# Patient Record
Sex: Female | Born: 1960 | Race: White | Hispanic: No | Marital: Married | Smoking: Never smoker
Health system: Southern US, Community
[De-identification: ages and names within clinical notes are randomized; demographics above are authoritative.]

## PROBLEM LIST (undated history)

## (undated) DIAGNOSIS — E7800 Pure hypercholesterolemia, unspecified: Secondary | ICD-10-CM

## (undated) DIAGNOSIS — I10 Essential (primary) hypertension: Secondary | ICD-10-CM

## (undated) DIAGNOSIS — G4730 Sleep apnea, unspecified: Secondary | ICD-10-CM

## (undated) DIAGNOSIS — E119 Type 2 diabetes mellitus without complications: Secondary | ICD-10-CM

## (undated) DIAGNOSIS — R748 Abnormal levels of other serum enzymes: Secondary | ICD-10-CM

## (undated) HISTORY — PX: KNEE SURGERY: SHX244

## (undated) HISTORY — PX: EYE SURGERY: SHX253

---

## 2009-12-10 ENCOUNTER — Inpatient Hospital Stay (HOSPITAL_COMMUNITY): Admission: EM | Admit: 2009-12-10 | Discharge: 2009-12-12 | Payer: Self-pay | Admitting: Emergency Medicine

## 2010-11-08 IMAGING — CR DG CHEST 2V
2 series · 2 of 2 positions shown · non-contrast
Comparison: PA and lateral chest 12/10/2009.

CLINICAL DATA: Fever and cough.

CHEST - 2 VIEW

[view not recorded (1 of 2)]
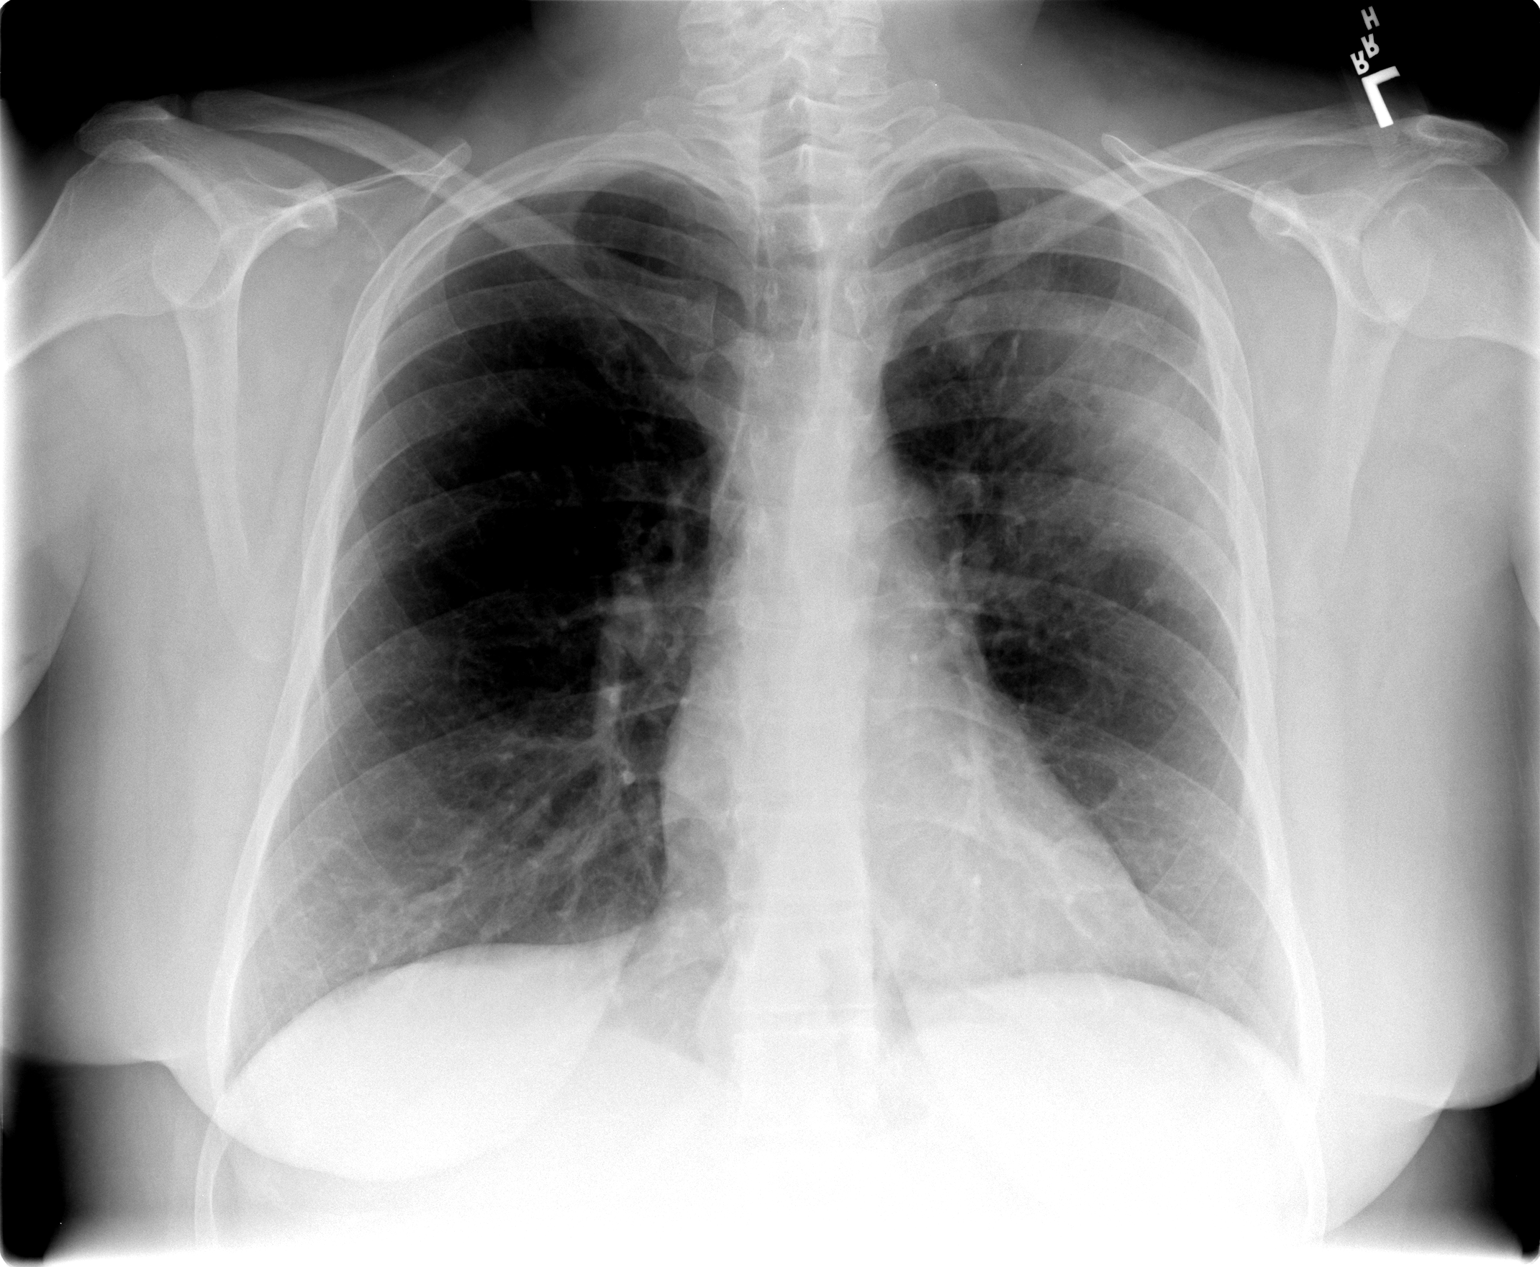

[view not recorded (2 of 2)]
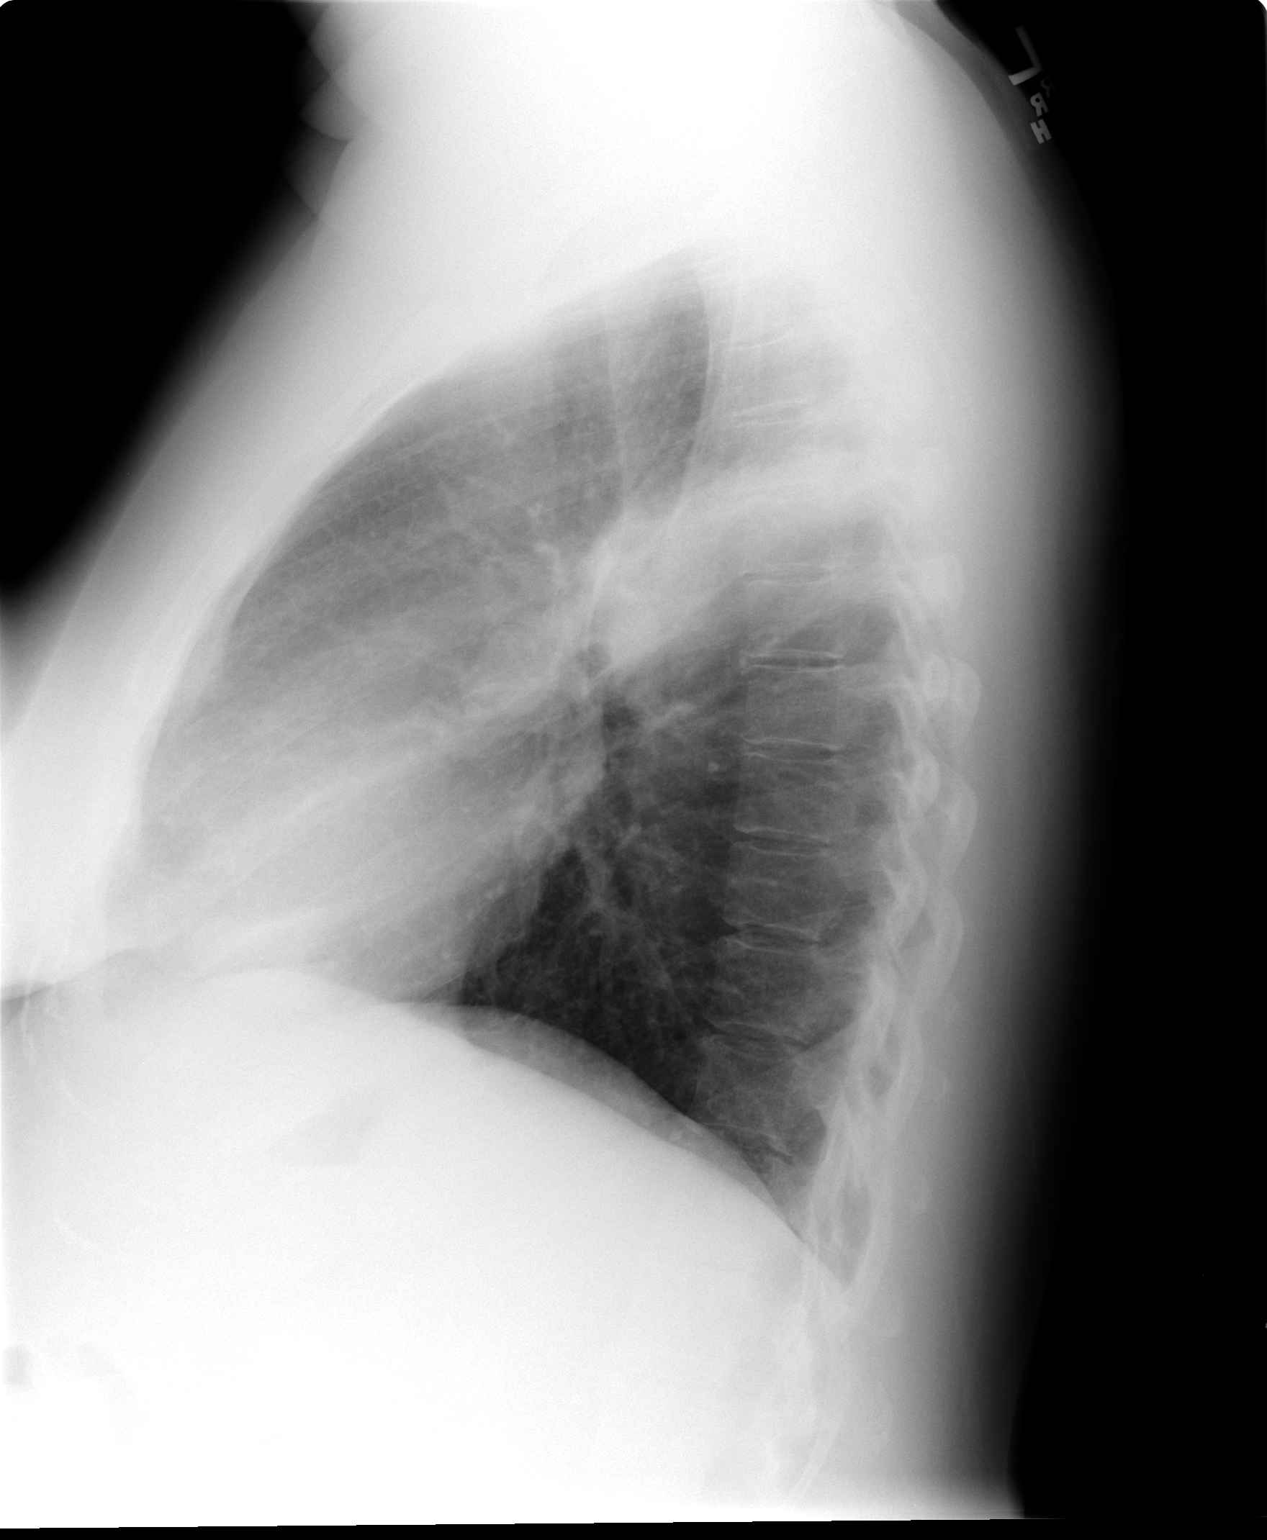

[2 of 2 positions shown; findings below may reference images not displayed]

FINDINGS: There has been no interval change in airspace disease in
the posterior segment of the left upper lobe.  Right lung remains
clear.  Heart size is normal.  No effusion.
IMPRESSION: No change in left upper lobe pneumonia.

## 2011-03-24 LAB — COMPREHENSIVE METABOLIC PANEL
AST: 27 U/L (ref 0–37)
AST: 29 U/L (ref 0–37)
Albumin: 2.5 g/dL — ABNORMAL LOW (ref 3.5–5.2)
Albumin: 3.4 g/dL — ABNORMAL LOW (ref 3.5–5.2)
BUN: 12 mg/dL (ref 6–23)
BUN: 9 mg/dL (ref 6–23)
Calcium: 8 mg/dL — ABNORMAL LOW (ref 8.4–10.5)
Calcium: 9.1 mg/dL (ref 8.4–10.5)
Chloride: 94 mEq/L — ABNORMAL LOW (ref 96–112)
Creatinine, Ser: 0.74 mg/dL (ref 0.4–1.2)
Creatinine, Ser: 0.95 mg/dL (ref 0.4–1.2)
Total Protein: 5.8 g/dL — ABNORMAL LOW (ref 6.0–8.3)
Total Protein: 7.6 g/dL (ref 6.0–8.3)

## 2011-03-24 LAB — DIFFERENTIAL
Basophils Absolute: 0 10*3/uL (ref 0.0–0.1)
Basophils Relative: 0 % (ref 0–1)
Basophils Relative: 0 % (ref 0–1)
Eosinophils Relative: 0 % (ref 0–5)
Eosinophils Relative: 1 % (ref 0–5)
Lymphocytes Relative: 5 % — ABNORMAL LOW (ref 12–46)
Monocytes Absolute: 1 10*3/uL (ref 0.1–1.0)
Monocytes Relative: 5 % (ref 3–12)
Neutro Abs: 15.3 10*3/uL — ABNORMAL HIGH (ref 1.7–7.7)
Neutro Abs: 5.9 10*3/uL (ref 1.7–7.7)
Neutrophils Relative %: 90 % — ABNORMAL HIGH (ref 43–77)

## 2011-03-24 LAB — URINE MICROSCOPIC-ADD ON

## 2011-03-24 LAB — CBC
HCT: 33.6 % — ABNORMAL LOW (ref 36.0–46.0)
HCT: 41.5 % (ref 36.0–46.0)
Hemoglobin: 10.6 g/dL — ABNORMAL LOW (ref 12.0–15.0)
Hemoglobin: 11.2 g/dL — ABNORMAL LOW (ref 12.0–15.0)
MCHC: 33.1 g/dL (ref 30.0–36.0)
MCHC: 33.2 g/dL (ref 30.0–36.0)
MCHC: 33.6 g/dL (ref 30.0–36.0)
Platelets: 202 10*3/uL (ref 150–400)
Platelets: 207 10*3/uL (ref 150–400)
Platelets: 246 10*3/uL (ref 150–400)
RDW: 14.2 % (ref 11.5–15.5)
RDW: 14.7 % (ref 11.5–15.5)
RDW: 14.9 % (ref 11.5–15.5)

## 2011-03-24 LAB — BASIC METABOLIC PANEL
BUN: 7 mg/dL (ref 6–23)
Creatinine, Ser: 0.74 mg/dL (ref 0.4–1.2)
GFR calc non Af Amer: >60 mL/min (ref >60)
Glucose, Bld: 108 mg/dL — ABNORMAL HIGH (ref 70–99)
Potassium: 4 mEq/L (ref 3.5–5.1)

## 2011-03-24 LAB — CARDIAC PANEL(CRET KIN+CKTOT+MB+TROPI)
CK, MB: 0.8 ng/mL (ref 0.3–4.0)
CK, MB: 1.1 ng/mL (ref 0.3–4.0)
Total CK: 118 U/L (ref 7–177)

## 2011-03-24 LAB — LACTIC ACID, PLASMA: Lactic Acid, Venous: 2.2 mmol/L (ref 0.5–2.2)

## 2011-03-24 LAB — URINALYSIS, ROUTINE W REFLEX MICROSCOPIC
Glucose, UA: NEGATIVE mg/dL
Leukocytes, UA: NEGATIVE
Protein, ur: 100 mg/dL — AB
pH: 5.5 (ref 5.0–8.0)

## 2011-03-24 LAB — LIPID PANEL
HDL: 27 mg/dL — ABNORMAL LOW (ref >39)
Total CHOL/HDL Ratio: 4.3 RATIO
Triglycerides: 136 mg/dL (ref <150)

## 2011-03-24 LAB — CULTURE, BLOOD (ROUTINE X 2)
Culture: NO GROWTH
Report Status: 12262010

## 2011-03-24 LAB — URINE CULTURE: Colony Count: 30000

## 2011-03-24 LAB — TSH: TSH: 0.528 u[IU]/mL (ref 0.350–4.500)

## 2019-08-02 ENCOUNTER — Encounter (HOSPITAL_COMMUNITY): Payer: Self-pay | Admitting: Emergency Medicine

## 2019-08-02 ENCOUNTER — Emergency Department (HOSPITAL_COMMUNITY): Payer: Self-pay

## 2019-08-02 ENCOUNTER — Other Ambulatory Visit: Payer: Self-pay

## 2019-08-02 ENCOUNTER — Emergency Department (HOSPITAL_COMMUNITY)
Admission: EM | Admit: 2019-08-02 | Discharge: 2019-08-02 | Disposition: A | Discharge status: Home / Self Care | Payer: Self-pay | Attending: Emergency Medicine | Admitting: Emergency Medicine

## 2019-08-02 DIAGNOSIS — E119 Type 2 diabetes mellitus without complications: Secondary | ICD-10-CM | POA: Insufficient documentation

## 2019-08-02 DIAGNOSIS — I10 Essential (primary) hypertension: Secondary | ICD-10-CM | POA: Insufficient documentation

## 2019-08-02 DIAGNOSIS — R42 Dizziness and giddiness: Secondary | ICD-10-CM | POA: Insufficient documentation

## 2019-08-02 HISTORY — DX: Sleep apnea, unspecified: G47.30

## 2019-08-02 HISTORY — DX: Type 2 diabetes mellitus without complications: E11.9

## 2019-08-02 HISTORY — DX: Essential (primary) hypertension: I10

## 2019-08-02 HISTORY — DX: Abnormal levels of other serum enzymes: R74.8

## 2019-08-02 HISTORY — DX: Pure hypercholesterolemia, unspecified: E78.00

## 2019-08-02 LAB — BASIC METABOLIC PANEL
Anion gap: 12 (ref 5–15)
BUN: 15 mg/dL (ref 6–20)
CO2: 27 mmol/L (ref 22–32)
Calcium: 9.7 mg/dL (ref 8.9–10.3)
Chloride: 102 mmol/L (ref 98–111)
Creatinine, Ser: 0.64 mg/dL (ref 0.44–1.00)
GFR calc Af Amer: >60 mL/min (ref >60)
GFR calc non Af Amer: >60 mL/min (ref >60)
Glucose, Bld: 93 mg/dL (ref 70–99)
Potassium: 4.1 mmol/L (ref 3.5–5.1)
Sodium: 141 mmol/L (ref 135–145)

## 2019-08-02 LAB — URINALYSIS, ROUTINE W REFLEX MICROSCOPIC
Bacteria, UA: NONE SEEN
Bilirubin Urine: NEGATIVE
Glucose, UA: NEGATIVE mg/dL
Hgb urine dipstick: NEGATIVE
Ketones, ur: NEGATIVE mg/dL
Nitrite: NEGATIVE
Protein, ur: NEGATIVE mg/dL
Specific Gravity, Urine: 1.019 (ref 1.005–1.030)
pH: 5 (ref 5.0–8.0)

## 2019-08-02 LAB — CBC WITH DIFFERENTIAL/PLATELET
Abs Immature Granulocytes: 0.03 10*3/uL (ref 0.00–0.07)
Basophils Absolute: 0 10*3/uL (ref 0.0–0.1)
Basophils Relative: 0 %
Eosinophils Absolute: 0.2 10*3/uL (ref 0.0–0.5)
Eosinophils Relative: 2 %
HCT: 42.6 % (ref 36.0–46.0)
Hemoglobin: 13.4 g/dL (ref 12.0–15.0)
Immature Granulocytes: 0 %
Lymphocytes Relative: 33 %
Lymphs Abs: 2.8 10*3/uL (ref 0.7–4.0)
MCH: 27.8 pg (ref 26.0–34.0)
MCHC: 31.5 g/dL (ref 30.0–36.0)
MCV: 88.4 fL (ref 80.0–100.0)
Monocytes Absolute: 0.7 10*3/uL (ref 0.1–1.0)
Monocytes Relative: 8 %
Neutro Abs: 4.8 10*3/uL (ref 1.7–7.7)
Neutrophils Relative %: 57 %
Platelets: 238 10*3/uL (ref 150–400)
RBC: 4.82 MIL/uL (ref 3.87–5.11)
RDW: 13.7 % (ref 11.5–15.5)
WBC: 8.5 10*3/uL (ref 4.0–10.5)
nRBC: 0 % (ref 0.0–0.2)

## 2019-08-02 MED ORDER — MECLIZINE HCL 25 MG PO TABS: 25.0000 mg | ORAL_TABLET | Freq: Three times a day (TID) | ORAL | 0 refills | Status: AC | PRN

## 2019-08-02 NOTE — ED Triage Notes (Signed)
Patient complaining of dizziness x 3 weeks. States she was seen by urgent care x 2 including today and sent here for treatment. Denies pain.

## 2019-08-02 NOTE — Discharge Instructions (Addendum)
Take Meclizine as prescribed.  Follow up with your doctor for recheck. Return to the ER for new or worsening symptoms.

## 2019-08-02 NOTE — ED Provider Notes (Signed)
[facility] Provider Note   CSN: [id number] Arrival date & time: 08/02/19  1200    History   Chief Complaint Chief Complaint  Patient presents with  . Dizziness    HPI Jo Spence is a 58 y.o. female.     58yo female sent by UC for evaluation of dizziness x 3 weeks. Patient reports first episode of dizziness 3 weeks ago, got out of bed and felt dizzy/room spinning, and fell to the ground. Episode was similar to prior episodes of vertigo. Patient felt like she was gradually improving until last week when she went to get her hair done and leaned her head back and had return of severe dizziness. Patient reports dizziness returns with leaning her head back- like today when she went to look in the oven, resolves with bringing her head back to a neutral position. Reports associated nausea with the dizziness as well as fullness in her ears and frontal sinus area. Reports hitting her left frontal area on Father's Day and 2 other incidents where she has hit her head, unsure if related, no significant headaches, is not taking blood thinners, has not taken anything for the dizziness. Denies changes in vision or speech, denies unilateral weakness. No other complaints or concerns.      Past Medical History:  Diagnosis Date  . Diabetes mellitus without complication (HCC)   . Elevated liver enzymes   . High cholesterol   . Hypertension   . Sleep apnea     There are no active problems to display for this patient.   Past Surgical History:  Procedure Laterality Date  . EYE SURGERY    . KNEE SURGERY       OB History   No obstetric history on file.      Home Medications    Prior to Admission medications   Medication Sig Start Date End Date Taking? Authorizing Provider  meclizine (ANTIVERT) 25 MG tablet Take 1 tablet (25 mg total) by mouth 3 (three) times daily as needed for dizziness. 08/02/19   Jeannie FendMurphy, Suad Autrey A, PA-C    Family History History reviewed. No  pertinent family history.  Social History Social History   Tobacco Use  . Smoking status: Never Smoker  . Smokeless tobacco: Never Used  Substance Use Topics  . Alcohol use: Never    Frequency: Never  . Drug use: Never     Allergies   Patient has no known allergies.   Review of Systems Review of Systems  Eyes: Negative for visual disturbance.  Gastrointestinal: Positive for nausea. Negative for vomiting.  Musculoskeletal: Negative for gait problem, neck pain and neck stiffness.  Skin: Negative for rash and wound.  Allergic/Immunologic: Negative for immunocompromised state.  Neurological: Positive for dizziness. Negative for facial asymmetry, speech difficulty, weakness and light-headedness.  Hematological: Does not bruise/bleed easily.  Psychiatric/Behavioral: Negative for confusion.  All other systems reviewed and are negative.    Physical Exam Updated Vital Signs BP (!) 149/79 (BP Location: Right Arm)   Pulse 87   Temp 98.2 F (36.8 C) (Oral)   Resp 16   Ht 5\' 3"  (1.6 m)   Wt 88.9 kg   SpO2 96%   BMI 34.72 kg/m   Physical Exam Vitals signs and nursing note reviewed.  Constitutional:      General: She is not in acute distress.    Appearance: She is well-developed. She is not diaphoretic.  HENT:     Head: Normocephalic and atraumatic.  Right Ear: Tympanic membrane and ear canal normal.     Left Ear: Tympanic membrane normal.     Ears:     Comments: Cerumen in left ear canal, does not totally obstruct view of TM.    Mouth/Throat:     Mouth: Mucous membranes are moist.     Pharynx: Oropharynx is clear.  Eyes:     General: No visual field deficit.    Extraocular Movements: Extraocular movements intact.     Pupils: Pupils are equal, round, and reactive to light.  Cardiovascular:     Rate and Rhythm: Normal rate and regular rhythm.     Pulses: Normal pulses.     Heart sounds: Normal heart sounds.  Pulmonary:     Effort: Pulmonary effort is normal.      Breath sounds: Normal breath sounds.  Skin:    General: Skin is warm and dry.     Findings: No erythema or rash.  Neurological:     Mental Status: She is alert and oriented to person, place, and time.     GCS: GCS eye subscore is 4. GCS verbal subscore is 5. GCS motor subscore is 6.     Cranial Nerves: Cranial nerves are intact. No cranial nerve deficit, dysarthria or facial asymmetry.     Sensory: Sensation is intact. No sensory deficit.     Motor: No weakness or pronator drift.     Coordination: Romberg sign negative. Coordination normal.     Gait: Gait normal.     Deep Tendon Reflexes: Reflexes normal.  Psychiatric:        Behavior: Behavior normal.      ED Treatments / Results  Labs (all labs ordered are listed, but only abnormal results are displayed) Labs Reviewed  URINALYSIS, ROUTINE W REFLEX MICROSCOPIC - Abnormal; Notable for the following components:      Result Value   Leukocytes,Ua TRACE (*)    All other components within normal limits  CBC WITH DIFFERENTIAL/PLATELET  BASIC METABOLIC PANEL    EKG None  Radiology Ct Head Wo Contrast  Result Date: 08/02/2019 CLINICAL DATA:  Vertigo, episodic EXAM: CT HEAD WITHOUT CONTRAST TECHNIQUE: Contiguous axial images were obtained from the base of the skull through the vertex without intravenous contrast. COMPARISON:  None. FINDINGS: Brain: No evidence of acute territorial infarction, hemorrhage, hydrocephalus,extra-axial collection or mass lesion/mass effect. Normal gray-white differentiation. Ventricles are normal in size and contour. Vascular: No hyperdense vessel or unexpected calcification. Skull: The skull is intact. No fracture or focal lesion identified. Sinuses/Orbits: The visualized paranasal sinuses and mastoid air cells are clear. The orbits and globes intact. Other: None IMPRESSION: No acute intracranial abnormality. Electronically Signed   By: Prudencio Pair M.D.   On: 08/02/2019 18:48    Procedures Procedures  (including critical care time)  Medications Ordered in ED Medications - No data to display   Initial Impression / Assessment and Plan / ED Course  I have reviewed the triage vital signs and the nursing notes.  Pertinent labs & imaging results that were available during my care of the patient were reviewed by me and considered in my medical decision making (see chart for details).  Clinical Course as of Aug 02 1927  Tue Aug 02, 2019  [birth year]26 58yo female with complaint of dizziness x 3 weeks, feels similar to prior vertigo, reproduced with looking up. CT head unremarkable, labs reassuring, exam normal. Given Meclizine for possible vertigo, recheck with PCP, return to ER for new or worsening symptoms.    [  LM]    Clinical Course User Index [LM] Jeannie FendMurphy, Kao Berkheimer A, PA-C       Final Clinical Impressions(s) / ED Diagnoses   Final diagnoses:  Dizziness    ED Discharge Orders         Ordered    meclizine (ANTIVERT) 25 MG tablet  3 times daily PRN     08/02/19 1924           Alden HippMurphy, Britzy Graul A, PA-C 08/02/19 [id number]    Kohut, Stephen, MD 08/04/19 [phone]

## 2020-06-29 IMAGING — CT CT HEAD WITHOUT CONTRAST
3 series · 16 of 47 positions shown, 19 images · non-contrast
Comparison: None.

CLINICAL DATA: Vertigo, episodic

EXAM:
CT HEAD WITHOUT CONTRAST
TECHNIQUE: Contiguous axial images were obtained from the base of the skull
through the vertex without intravenous contrast.

[Series 2: head w o · axial · 0.41mm/px · z∈[+107,+232]mm · 10 of 30 slices shown, 13 images]
[im 3/30  brain]
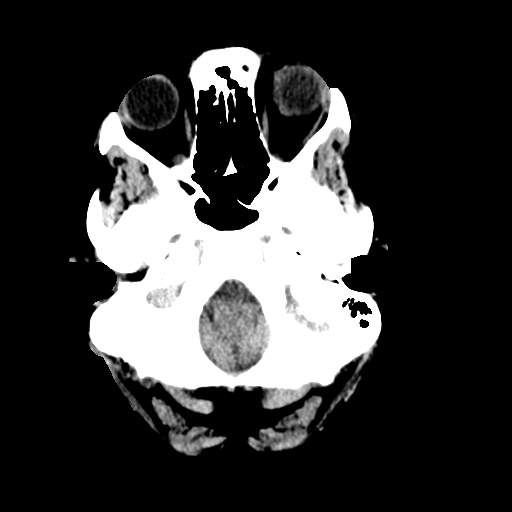
[im 3/30  bone]
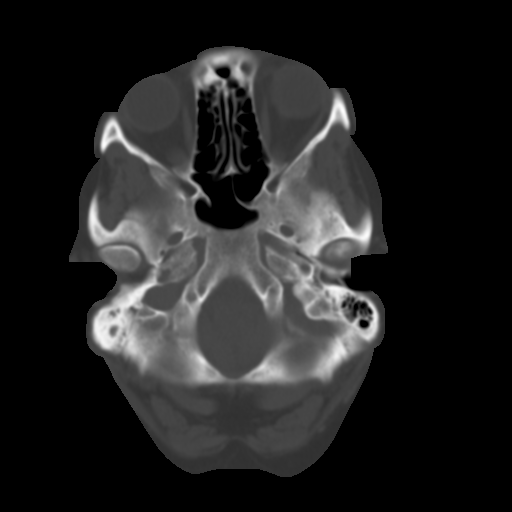
[im 6/30  brain]
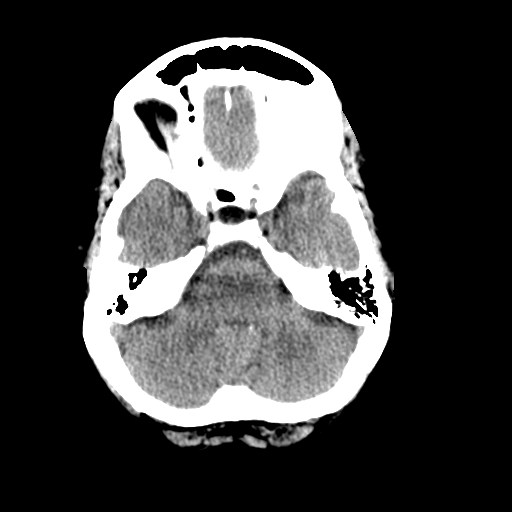
[im 9/30  brain]
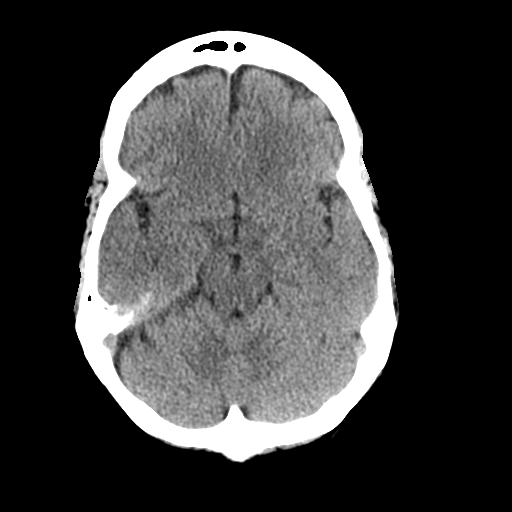
[im 11/30  brain]
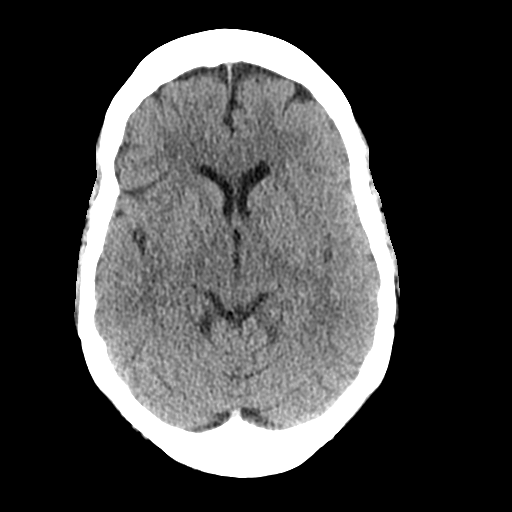
[im 14/30  brain]
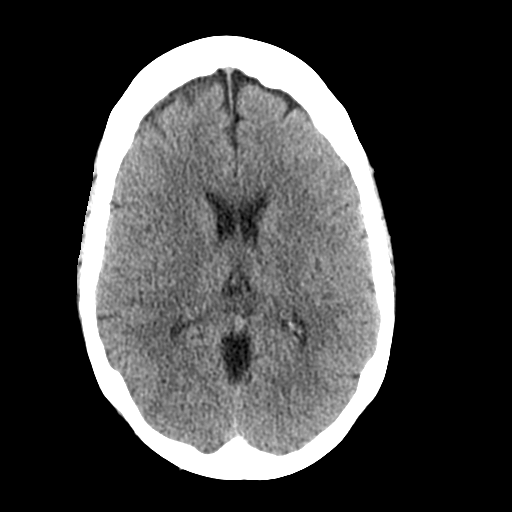
[im 14/30  bone]
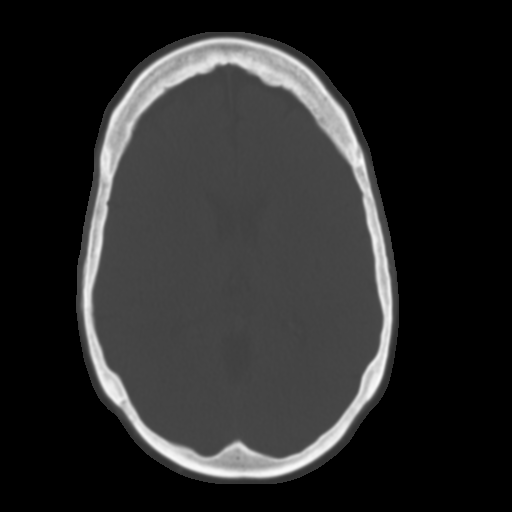
[im 17/30  brain]
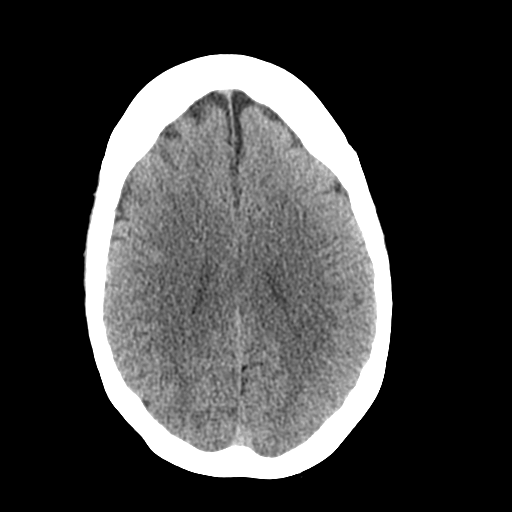
[im 20/30  brain]
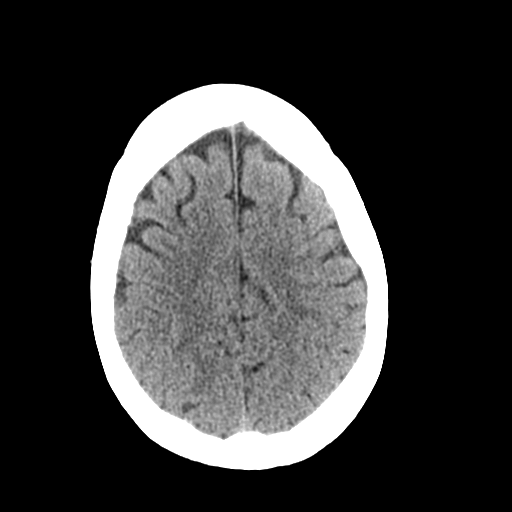
[im 23/30  brain]
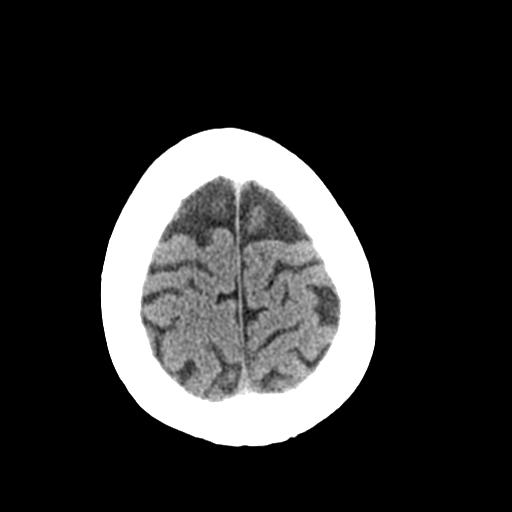
[im 25/30  brain]
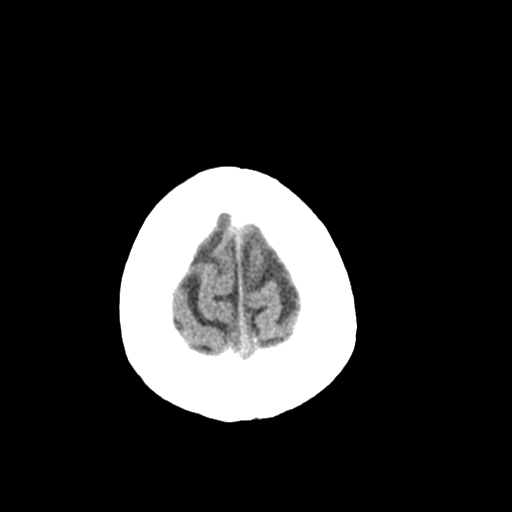
[im 25/30  bone]
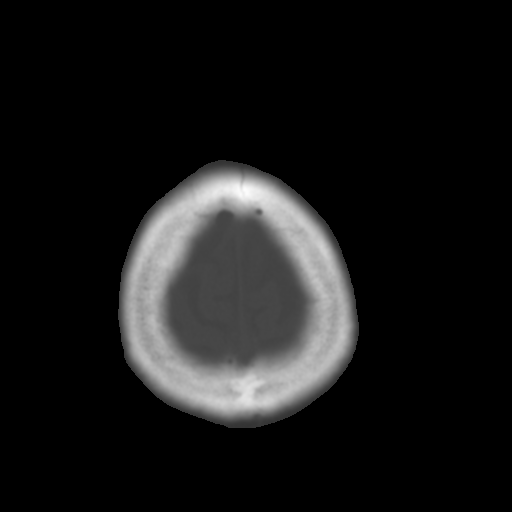
[im 28/30  brain]
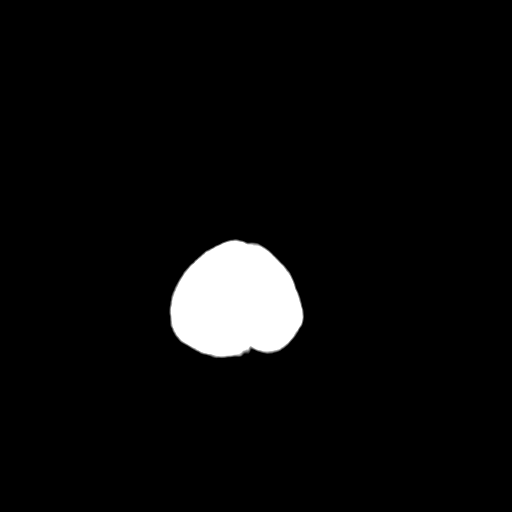

[Series 4: coronal soft · coronal · 0.29mm/px · 3 of 65 slices shown]
[im 22/65  brain]
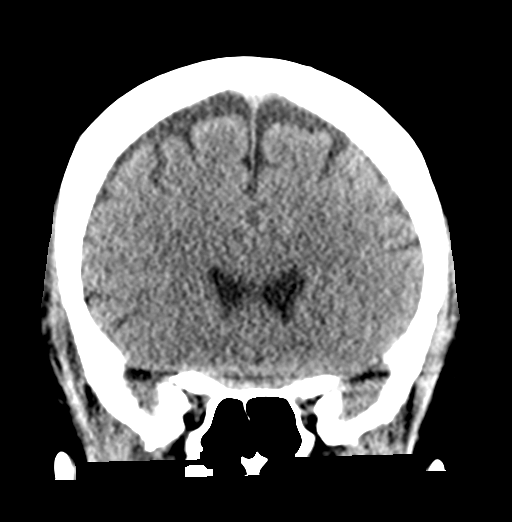
[im 29/65  brain]
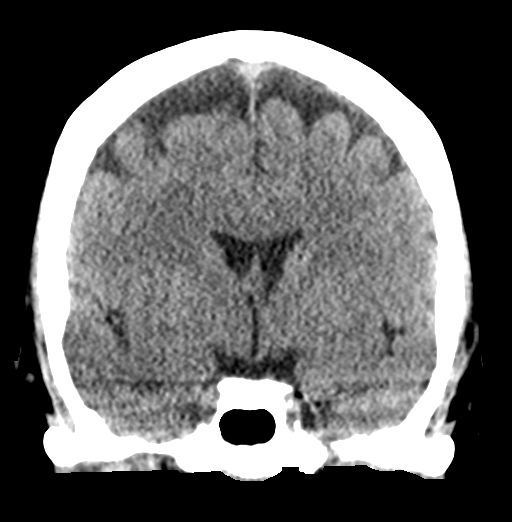
[im 36/65  brain]
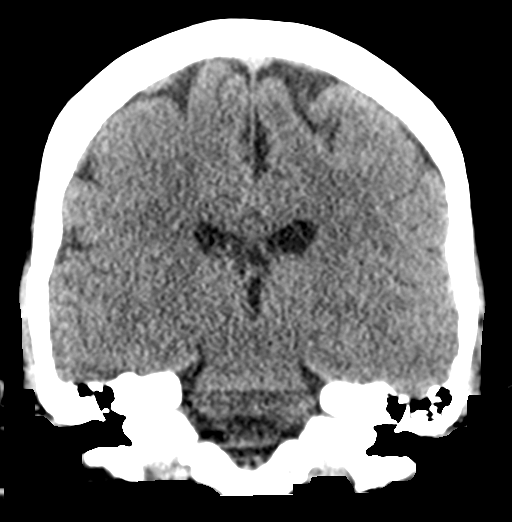

[Series 5: sagittal soft · sagittal · 0.32mm/px · 3 of 56 slices shown]
[im 19/56  brain]
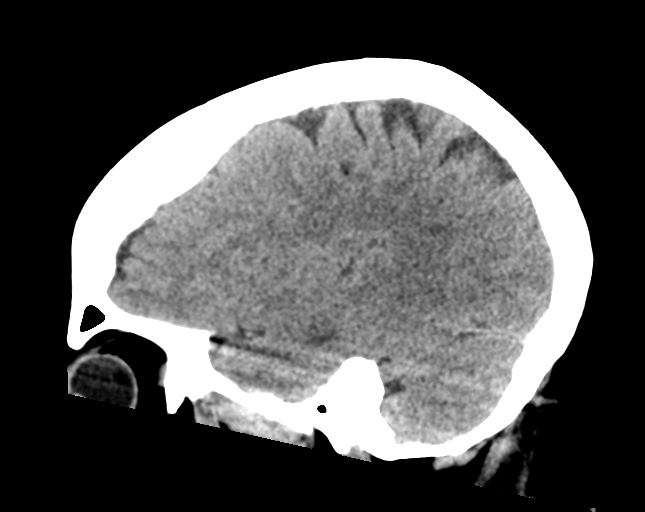
[im 28/56  brain]
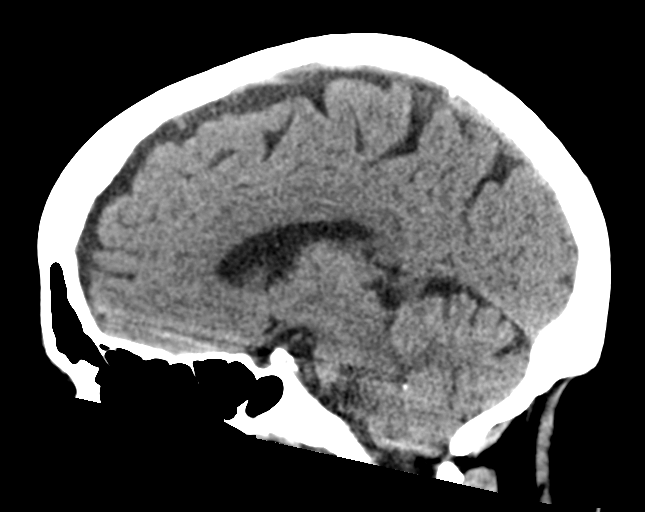
[im 37/56  brain]
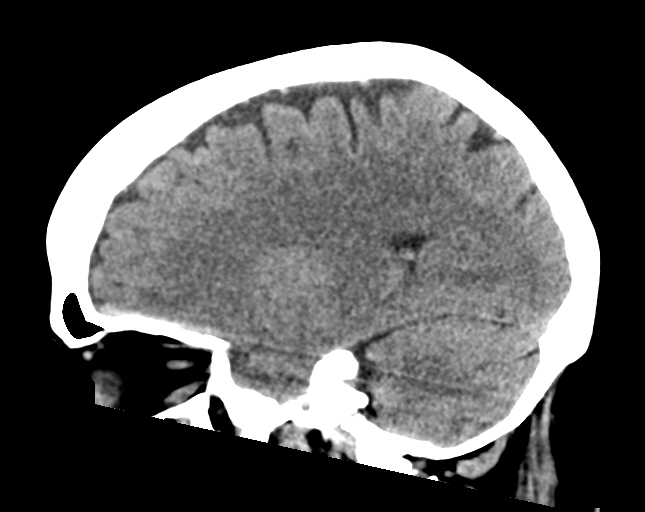

[16 of 47 positions shown; findings below may reference images not displayed]

FINDINGS: Brain: No evidence of acute territorial infarction, hemorrhage,
hydrocephalus,extra-axial collection or mass lesion/mass effect.
Normal gray-white differentiation. Ventricles are normal in size and
contour.

Vascular: No hyperdense vessel or unexpected calcification.

Skull: The skull is intact. No fracture or focal lesion identified.

Sinuses/Orbits: The visualized paranasal sinuses and mastoid air
cells are clear. The orbits and globes intact.

Other: None
IMPRESSION: No acute intracranial abnormality.

## 2024-01-25 ENCOUNTER — Emergency Department (HOSPITAL_COMMUNITY)
Admission: EM | Admit: 2024-01-25 | Discharge: 2024-01-25 | Disposition: A | Discharge status: Home / Self Care | Payer: Self-pay | Attending: Emergency Medicine | Admitting: Emergency Medicine

## 2024-01-25 ENCOUNTER — Other Ambulatory Visit: Payer: Self-pay

## 2024-01-25 ENCOUNTER — Encounter (HOSPITAL_COMMUNITY): Payer: Self-pay

## 2024-01-25 ENCOUNTER — Emergency Department (HOSPITAL_COMMUNITY): Payer: Self-pay

## 2024-01-25 DIAGNOSIS — I10 Essential (primary) hypertension: Secondary | ICD-10-CM | POA: Insufficient documentation

## 2024-01-25 DIAGNOSIS — E119 Type 2 diabetes mellitus without complications: Secondary | ICD-10-CM | POA: Insufficient documentation

## 2024-01-25 DIAGNOSIS — M5412 Radiculopathy, cervical region: Secondary | ICD-10-CM | POA: Insufficient documentation

## 2024-01-25 LAB — CBC WITH DIFFERENTIAL/PLATELET
Abs Immature Granulocytes: 0.03 10*3/uL (ref 0.00–0.07)
Basophils Absolute: 0.1 10*3/uL (ref 0.0–0.1)
Basophils Relative: 1 %
Eosinophils Absolute: 0.2 10*3/uL (ref 0.0–0.5)
Eosinophils Relative: 2 %
HCT: 43.8 % (ref 36.0–46.0)
Hemoglobin: 14.5 g/dL (ref 12.0–15.0)
Immature Granulocytes: 0 %
Lymphocytes Relative: 30 %
Lymphs Abs: 3.1 10*3/uL (ref 0.7–4.0)
MCH: 27.9 pg (ref 26.0–34.0)
MCHC: 33.1 g/dL (ref 30.0–36.0)
MCV: 84.4 fL (ref 80.0–100.0)
Monocytes Absolute: 0.7 10*3/uL (ref 0.1–1.0)
Monocytes Relative: 7 %
Neutro Abs: 6.3 10*3/uL (ref 1.7–7.7)
Neutrophils Relative %: 60 %
Platelets: 266 10*3/uL (ref 150–400)
RBC: 5.19 MIL/uL — ABNORMAL HIGH (ref 3.87–5.11)
RDW: 14.4 % (ref 11.5–15.5)
WBC: 10.4 10*3/uL (ref 4.0–10.5)
nRBC: 0 % (ref 0.0–0.2)

## 2024-01-25 LAB — BASIC METABOLIC PANEL
Anion gap: 12 (ref 5–15)
BUN: 18 mg/dL (ref 8–23)
CO2: 28 mmol/L (ref 22–32)
Calcium: 9.3 mg/dL (ref 8.9–10.3)
Chloride: 100 mmol/L (ref 98–111)
Creatinine, Ser: 0.69 mg/dL (ref 0.44–1.00)
GFR, Estimated: >60 mL/min (ref >60)
Glucose, Bld: 104 mg/dL — ABNORMAL HIGH (ref 70–99)
Potassium: 3.8 mmol/L (ref 3.5–5.1)
Sodium: 140 mmol/L (ref 135–145)

## 2024-01-25 LAB — TROPONIN I (HIGH SENSITIVITY)
Troponin I (High Sensitivity): 2 ng/L (ref <18)
Troponin I (High Sensitivity): 2 ng/L (ref <18)

## 2024-01-25 LAB — D-DIMER, QUANTITATIVE: D-Dimer, Quant: <0.27 ug{FEU}/mL (ref 0.00–0.50)

## 2024-01-25 MED ORDER — METHYLPREDNISOLONE 4 MG PO TBPK: ORAL_TABLET | ORAL | 0 refills | Status: AC

## 2024-01-25 MED ORDER — PREDNISONE 20 MG PO TABS
40.0000 mg | ORAL_TABLET | Freq: Once | ORAL | Status: AC
  Administered 2024-01-25: 40 mg via ORAL
  Filled 2024-01-25: qty 2

## 2024-01-25 NOTE — ED Provider Notes (Signed)
[place] EMERGENCY DEPARTMENT AT [facility] Provider Note   CSN: [id number] Arrival date & time: 01/25/24  1530     History  Chief Complaint  Patient presents with   Arm Pain    Jo Spence is a 63 y.o. female.   Arm Pain   This patient is a 63 year old female, she denies history of coronary disease, she presents with several days of symptoms including pain in the left arm which seems to radiate from the neck down through the shoulder the biceps into the hand.  It is generally located in the biceps region but can radiate to the hand at times.  She states that she feels like her arm is swollen but does not notice any visual swelling.  There is no symptoms in the legs, the pain also radiates to the left upper chest and the left upper back, it is not worse with breathing, there is no associated fevers or coughing or shortness of breath.  She reports that she did have nausea a couple of days ago but that is now resolved.  There is no vomiting or diarrhea.    Home Medications Prior to Admission medications   Medication Sig Start Date End Date Taking? Authorizing Provider  methylPREDNISolone (MEDROL DOSEPAK) 4 MG TBPK tablet Taper over 6 days 01/25/24  Yes Eber Hong, MD  meclizine (ANTIVERT) 25 MG tablet Take 1 tablet (25 mg total) by mouth 3 (three) times daily as needed for dizziness. 08/02/19   Jeannie Fend, PA-C      Allergies    Patient has no known allergies.    Review of Systems   Review of Systems  All other systems reviewed and are negative.   Physical Exam Updated Vital Signs BP (!) 145/82 (BP Location: Right Arm)   Pulse 77   Temp 98.2 F (36.8 C) (Oral)   Resp 18   Ht 1.6 m (5\' 3" )   Wt 89 kg   SpO2 94%   BMI 34.76 kg/m  Physical Exam Vitals and nursing note reviewed.  Constitutional:      General: She is not in acute distress.    Appearance: She is well-developed.  HENT:     Head: Normocephalic and atraumatic.     Mouth/Throat:      Pharynx: No oropharyngeal exudate.  Eyes:     General: No scleral icterus.       Right eye: No discharge.        Left eye: No discharge.     Conjunctiva/sclera: Conjunctivae normal.     Pupils: Pupils are equal, round, and reactive to light.  Neck:     Thyroid: No thyromegaly.     Vascular: No JVD.  Cardiovascular:     Rate and Rhythm: Normal rate and regular rhythm.     Heart sounds: Normal heart sounds. No murmur heard.    No friction rub. No gallop.  Pulmonary:     Effort: Pulmonary effort is normal. No respiratory distress.     Breath sounds: Normal breath sounds. No wheezing or rales.  Abdominal:     General: Bowel sounds are normal. There is no distension.     Palpations: Abdomen is soft. There is no mass.     Tenderness: There is no abdominal tenderness.  Musculoskeletal:        General: No tenderness. Normal range of motion.     Cervical back: Normal range of motion and neck supple.  Lymphadenopathy:     Cervical: No  cervical adenopathy.  Skin:    General: Skin is warm and dry.     Findings: No erythema or rash.  Neurological:     Mental Status: She is alert.     Coordination: Coordination normal.     Comments: Speech is clear, cranial nerves III through XII are intact, memory is intact, strength is normal in all 4 extremities including grips and strength at the bilateral thighs, knees and ankles to extention and flexion, sensation is intact to light touch and pinprick in all 4 extremities. Coordination as tested by finger-nose-finger is normal, no limb ataxia. Normal gait, normal reflexes at the patellar tendons bilaterally  Totally normal strength and sensation of the left upper extremity at the major muscle groups and dermatomes  Psychiatric:        Behavior: Behavior normal.     ED Results / Procedures / Treatments   Labs (all labs ordered are listed, but only abnormal results are displayed) Labs Reviewed  BASIC METABOLIC PANEL - Abnormal; Notable for the  following components:      Result Value   Glucose, Bld 104 (*)    All other components within normal limits  CBC WITH DIFFERENTIAL/PLATELET - Abnormal; Notable for the following components:   RBC 5.19 (*)    All other components within normal limits  D-DIMER, QUANTITATIVE  TROPONIN I (HIGH SENSITIVITY)  TROPONIN I (HIGH SENSITIVITY)    EKG EKG Interpretation Date/Time:  Monday January 25 2024 15:59:15 EST Ventricular Rate:  71 PR Interval:  148 QRS Duration:  70 QT Interval:  388 QTC Calculation: 421 R Axis:   57  Text Interpretation: Normal sinus rhythm Normal ECG When compared with ECG of 02-Aug-2019 12:22, No significant change was found Confirmed by Eber Hong ([id number]) on 01/25/2024 4:02:00 PM  Radiology DG Chest 2 View Result Date: 01/25/2024 CLINICAL DATA:  Chest pain and left arm pain since Friday. Nausea. History of diabetes and hypertension. Nonsmoker. EXAM: CHEST - 2 VIEW COMPARISON:  12/11/2009 FINDINGS: Normal heart size and pulmonary vascularity. No focal airspace disease or consolidation in the lungs. No blunting of costophrenic angles. No pneumothorax. Mediastinal contours appear intact. Calcification of the aorta. IMPRESSION: No active cardiopulmonary disease. Electronically Signed   By: Burman Nieves M.D.   On: 01/25/2024 17:35    Procedures Procedures    Medications Ordered in ED Medications  predniSONE (DELTASONE) tablet 40 mg (has no administration in time range)    ED Course/ Medical Decision Making/ A&P                                 Medical Decision Making Amount and/or Complexity of Data Reviewed Labs: ordered. Radiology: ordered.  Risk Prescription drug management.   The patient's symptoms are unusual and that it could be related to a radiculopathy, she does not have a history of coronary disease and does not have exertional symptoms, furthermore she has had symptoms for several days and has a negative troponin.  I will add a D-dimer to  make sure were not dealing with some type of dissection type process although that seems unusual and with a blood pressure of 145/82 with an unremarkable EKG it also makes it unusual.  The patient is agreeable to the plan  Labs: I personally viewed and interpreted labs which show negative D-dimer, negative troponin, metabolic panel and CBC unremarkable.  Imaging: I personally viewed and interpreted the x-ray of the chest which is  unremarkable showing no signs of acute cardiopulmonary disease.  EKG: Totally normal EKG without any significant findings of concern.  Medication management: Given prednisone and prescribed Medrol Dosepak  I have discussed with the patient at the bedside the results, and the meaning of these results.  They have had opportunity to ask questions,  expressed their understanding to the need for follow-up with primary care physician        Final Clinical Impression(s) / ED Diagnoses Final diagnoses:  Cervical radiculopathy    Rx / DC Orders ED Discharge Orders          Ordered    methylPREDNISolone (MEDROL DOSEPAK) 4 MG TBPK tablet        01/25/24 2204              Eber Hong, MD 01/25/24 2205

## 2024-01-25 NOTE — ED Triage Notes (Signed)
Pt arrived via POV c/o left arm pain since this past Friday. Pt reports pain has worsened and radiates to left shoulder and neck. Pt endorses nausea.

## 2024-01-25 NOTE — Discharge Instructions (Signed)
The testing today shows that you have no signs of heart problems lung problems of blood clots or serious complications of your heart or lungs.  All of your blood work has come back looking very reassuring.  I do want you to take the medication called Medrol over the next 6 days to help reduce any swelling that might be occurring on the nerve coming out of your neck and going to your shoulder and your arm in your back.  This should help, you will need to follow-up with your doctor within the next week, ER for severe pain or worsening weakness or numbness.
